# Patient Record
Sex: Male | Born: 1985 | Race: White | Hispanic: No | Marital: Married | Smoking: Former smoker
Health system: Southern US, Community
[De-identification: ages and names within clinical notes are randomized; demographics above are authoritative.]

## PROBLEM LIST (undated history)

## (undated) DIAGNOSIS — J302 Other seasonal allergic rhinitis: Secondary | ICD-10-CM

## (undated) DIAGNOSIS — G43909 Migraine, unspecified, not intractable, without status migrainosus: Secondary | ICD-10-CM

## (undated) DIAGNOSIS — F909 Attention-deficit hyperactivity disorder, unspecified type: Secondary | ICD-10-CM

## (undated) DIAGNOSIS — J45909 Unspecified asthma, uncomplicated: Secondary | ICD-10-CM

## (undated) HISTORY — DX: Other seasonal allergic rhinitis: J30.2

## (undated) HISTORY — DX: Unspecified asthma, uncomplicated: J45.909

## (undated) HISTORY — PX: TONSILLECTOMY AND ADENOIDECTOMY: SUR1326

## (undated) HISTORY — PX: ADENOIDECTOMY: SUR15

## (undated) HISTORY — DX: Attention-deficit hyperactivity disorder, unspecified type: F90.9

## (undated) HISTORY — DX: Migraine, unspecified, not intractable, without status migrainosus: G43.909

---

## 2005-01-07 ENCOUNTER — Emergency Department: Payer: Self-pay | Admitting: General Practice

## 2005-09-14 ENCOUNTER — Emergency Department (HOSPITAL_COMMUNITY): Admission: EM | Admit: 2005-09-14 | Discharge: 2005-09-14 | Payer: Self-pay | Admitting: Emergency Medicine

## 2006-03-01 ENCOUNTER — Ambulatory Visit (HOSPITAL_COMMUNITY): Admission: RE | Admit: 2006-03-01 | Discharge: 2006-03-01 | Payer: Self-pay | Admitting: Internal Medicine

## 2008-11-27 ENCOUNTER — Emergency Department (HOSPITAL_COMMUNITY): Admission: EM | Admit: 2008-11-27 | Discharge: 2008-11-27 | Payer: Self-pay | Admitting: Emergency Medicine

## 2008-12-30 ENCOUNTER — Emergency Department (HOSPITAL_COMMUNITY): Admission: EM | Admit: 2008-12-30 | Discharge: 2008-12-30 | Payer: Self-pay | Admitting: Emergency Medicine

## 2009-03-28 HISTORY — PX: COLONOSCOPY: SHX174

## 2009-04-05 ENCOUNTER — Emergency Department (HOSPITAL_COMMUNITY): Admission: EM | Admit: 2009-04-05 | Discharge: 2009-04-05 | Payer: Self-pay | Admitting: Emergency Medicine

## 2009-04-14 ENCOUNTER — Telehealth: Payer: Self-pay | Admitting: Gastroenterology

## 2009-05-05 ENCOUNTER — Ambulatory Visit: Payer: Self-pay | Admitting: Gastroenterology

## 2009-05-05 DIAGNOSIS — Z8719 Personal history of other diseases of the digestive system: Secondary | ICD-10-CM

## 2009-05-11 ENCOUNTER — Ambulatory Visit (HOSPITAL_COMMUNITY): Admission: RE | Admit: 2009-05-11 | Discharge: 2009-05-11 | Payer: Self-pay | Admitting: Gastroenterology

## 2009-05-13 ENCOUNTER — Encounter: Payer: Self-pay | Admitting: Gastroenterology

## 2009-05-25 ENCOUNTER — Ambulatory Visit: Payer: Self-pay | Admitting: Gastroenterology

## 2009-05-25 ENCOUNTER — Ambulatory Visit (HOSPITAL_COMMUNITY): Admission: RE | Admit: 2009-05-25 | Discharge: 2009-05-25 | Payer: Self-pay | Admitting: Gastroenterology

## 2009-06-08 ENCOUNTER — Ambulatory Visit: Payer: Self-pay | Admitting: Internal Medicine

## 2009-06-24 ENCOUNTER — Encounter: Payer: Self-pay | Admitting: Internal Medicine

## 2010-04-27 NOTE — Letter (Signed)
Summary: CT SCAN ORDER  CT SCAN ORDER   Imported By: Ave Filter 05/05/2009 16:52:44  _____________________________________________________________________  External Attachment:    Type:   Image     Comment:   External Document

## 2010-04-27 NOTE — Letter (Signed)
Summary: Internal Other  Internal Other   Imported By: Ave Filter 05/13/2009 13:01:30  _____________________________________________________________________  External Attachment:    Type:   Image     Comment:   External Document

## 2010-04-27 NOTE — Assessment & Plan Note (Signed)
Summary: DIVERTICULITISx1   Visit Type:  Initial Consult Primary Care Provider:  Margo Aye, M.D.  Chief Complaint:  diverticulitis.  History of Present Illness: Went to ED because he couldn't move from the pain in his back. Diagosed with the 1st episode of diverticulitis: pain in lower back, w/o constipation, abd pain, diarrhea, fever, or chiils, blood in stool, or black tarry stools. On Abx for 1 week: Sx got better but stiil has back pain ever so often. Appetite: here and there. No nausea, vomiting, heartburn, or indigestion. Had Ibuprofen and it did not help. Still has back pain.  Preventive Screening-Counseling & Management      Drug Use:  no.    Current Medications (verified): 1)  None  Allergies (verified): 1)  ! Asa 2)  ! Codeine  Past History:  Past Medical History: Kidney Stones (9) in 2010 R wrist fracture 2010  Past Surgical History: Tonsillectomy: age 25-5 yo  Family History: No FH of Colon Cancer Polyps: mother (< 55, path?), grandmother-diverticulosis No Family History of Breast Cancer: No Family History of Ovarian Cancer: No Family History of Pancreatic Cancer: No Family History of Stomach Cancer: No Family History of Uterine Cancer:  Social History: Quit chewing  ~ 1 week ago. Quit smoking 2 years. Occupation: work at [employer] and  [employer], Agricultural consultant at [employer]. Alcohol Use - yes 1-2x/mo, can go 3-4 mos w/o Illicit Drug Use - no Marriedx4 years, no kids, just horses and dogs.Drug Use:  no  Review of Systems       I personally reviewed CT AP w/o IV contrastJan 2011 w/ Dr. Montel Culver evidence of diverticulitis, SI joints normal.  Per HPI otherwise all systems negative.  Vital Signs:  Patient profile:   25 year old male Height:      72 inches Weight:      198 pounds BMI:     26.95 Temp:     97.9 degrees F oral Pulse rate:   80 / minute BP sitting:   112 / 70  (left arm) Cuff size:   regular  Vitals Entered By:  Hendricks Limes LPN (May 05, 2009 4:12 PM)  Physical Exam  General:  Well developed, well nourished, no acute distress. Head:  Normocephalic and atraumatic. Eyes:  PERRLA, no icterus. Mouth:  No deformity or lesions. Neck:  Supple; no masses. Lungs:  Clear throughout to auscultation. Heart:  Regular rate and rhythm; no murmurs Abdomen:  Soft,nondistended. No masses noted. Normal bowel sounds. Mild LLQ tenderness without guarding, and without rebound.   Extremities:  No clubbing, cyanosis, edema or deformities noted. Neurologic:  Alert and  oriented x4;  grossly normal neurologically.  Impression & Recommendations:  Problem # 1:  DIVERTICULITIS, HX OF (ICD-V12.79)  Differential diagnosis include low likelihood of IBD or colon cancer. Repeat Ct Scan A/P w/ iv and oral contrast ASAP. OPV in 4 weeks. Will need TCS in near future. VICODIN #25 as needed PAIN. Do not use with Tylenol. Side effects include drowsiness, constipation, and itching. May cause nausea and vomiting. Do not operate heavy machinery while taking Vicodin. Explained no long tern narcotics if no evidnec of inflammation on CT.  CC: PCP  ADDENDUM 2911: pt requested CT on feb 14 because it's his day off.  Orders: Consultation Level III (16109) Prescriptions: VICODIN 5-500 MG TABS (HYDROCODONE-ACETAMINOPHEN) 1-2 by mouth q4-6h as needed pain  #25 x 0   Entered and Authorized by:   West Bali MD   Signed by:  West Bali MD on 05/05/2009   Method used:   Print then Give to Patient   RxID:   [id number]

## 2010-04-27 NOTE — Progress Notes (Signed)
Summary: Diverticulitis 1st episode  Called dr. Margo Aye. Pt with episode of diverticulitis. Finishe Abx witin the next week. OPV w/ [facility] in first 2 weeks of FEB. May need an urgent slot. Will need TCS after visit. West Bali MD  April 14, 2009 3:51 PM

## 2010-04-27 NOTE — Letter (Signed)
Summary: release of records  release of records   Imported By: Diana Eves 06/24/2009 16:30:28  _____________________________________________________________________  External Attachment:    Type:   Image     Comment:   External Document

## 2010-04-27 NOTE — Assessment & Plan Note (Signed)
Summary: FU OV 4WKS(WEEK OF 05/26/09)GLU   Visit Type:  Follow-up Visit Primary Care Provider:  Dwana Melena  Chief Complaint:  F/U  diverticulitis.  History of Present Illness: Here for f/u. Since last OV he had TCS which showed frequent diverticula in the transverse, descending and sigmoid colon. He continues to have pain in the lower back, more on the left side. Pain occurs up to 2 times per day and lasts for 15 mins at a time. Denies fever, dysuria, hematuria, chronic back pain. Pain feels like when he was first diagnosed with diverticulitis in January. Does not feel like his kidney stones. BM only 1-2 times per day instead of like norm of 3-4. Stools formed, hard at times. No melena, brbpr.   Current Medications (verified): 1)  Vicodin 5-500 Mg Tabs (Hydrocodone-Acetaminophen) .Marland Kitchen.. 1-2 By Mouth Q4-6h As Needed Pain  Allergies (verified): 1)  ! Asa 2)  ! Codeine  Past History:  Past Medical History: Kidney Stones (9) in 2010 R wrist fracture 2010 Diverticulitis  Social History: Quit chewing  ~ 1 week ago. Quit smoking 2 years. Occupation: work at [employer] and  [employer], Agricultural consultant at [employer]. Alcohol Use - yes 1-2x/mo, can go 3-4 mos w/o Illicit Drug Use - no Marriedx4 years, no kids, just horses and dogs.  Review of Systems      See HPI  Vital Signs:  Patient profile:   25 year old male Height:      72 inches Weight:      198 pounds BMI:     26.95 Temp:     97.7 degrees F oral Pulse rate:   80 / minute BP sitting:   110 / 80  (left arm) Cuff size:   regular  Vitals Entered By: Cloria Spring LPN (June 08, 2009 1:13 PM)  Physical Exam  General:  Well developed, well nourished, no acute distress. Head:  Normocephalic and atraumatic. Eyes:   no scleral icterus.  Mouth:  OP moist. Lungs:  Clear throughout to auscultation. Heart:  Regular rate and rhythm; no murmurs, rubs,  or bruits. Abdomen:  Bowel sounds normal.  Abdomen is soft,  nontender, nondistended.  No rebound or guarding.  No hepatosplenomegaly, masses or hernias.  No abdominal bruits.  Msk:  No point tenderness of lower back. No rash.  Extremities:  No clubbing, cyanosis, edema or deformities noted. Neurologic:  Alert and  oriented x4;  grossly normal neurologically. Skin:  Intact without significant lesions or rashes. Psych:  Alert and cooperative. Normal mood and affect.  Impression & Recommendations:  Problem # 1:  DIVERTICULITIS, HX OF (ICD-V12.79)  No abdominal pain but continues to have back pain. Abd exam benign. CT one month ago showed near complete resolution of diverticulitis.  ?back pain secondary to recurrent low-grade diverticulitis vs renal stone vs musculoskeletal. Will discuss further with Dr. Darrick Penna. ?10 days of cipro/flagyl?  Orders: Est. Patient Level II (28315)     Appended Document: FU OV 4WKS(WEEK OF 05/26/09)GLU Discussed with Dr. Darrick Penna. Recommends for ongoing pain, to get another CT A/P to look for recurrent diverticulitis. Patient presented previously with atypical symptoms  ie back pain.   Called patient. States he was feeling better. Little upset stomach this am, ?secondary to something he ate. Patient will call on Monday with progress report. If persisting back/abd pain, then he will agree with CT, but he does not want to do right now since he feels better.   Advised if he develops worsening symptoms,  fever, vomiting over the weekend, to go to ED.

## 2010-06-13 LAB — COMPREHENSIVE METABOLIC PANEL
Albumin: 3.8 g/dL (ref 3.5–5.2)
Alkaline Phosphatase: 63 U/L (ref 39–117)
CO2: 28 mEq/L (ref 19–32)
Chloride: 98 mEq/L (ref 96–112)
GFR calc non Af Amer: >60 mL/min (ref >60)
Glucose, Bld: 88 mg/dL (ref 70–99)
Potassium: 3.8 mEq/L (ref 3.5–5.1)
Total Bilirubin: 1.6 mg/dL — ABNORMAL HIGH (ref 0.3–1.2)

## 2010-06-13 LAB — CBC
HCT: 44.3 % (ref 39.0–52.0)
MCHC: 34.6 g/dL (ref 30.0–36.0)
MCV: 88.2 fL (ref 78.0–100.0)
Platelets: 264 10*3/uL (ref 150–400)
WBC: 9.8 10*3/uL (ref 4.0–10.5)

## 2010-06-13 LAB — URINALYSIS, ROUTINE W REFLEX MICROSCOPIC
Ketones, ur: NEGATIVE mg/dL
Specific Gravity, Urine: 1.018 (ref 1.005–1.030)
pH: 6.5 (ref 5.0–8.0)

## 2010-06-13 LAB — DIFFERENTIAL
Basophils Relative: 0 % (ref 0–1)
Eosinophils Absolute: 0.3 10*3/uL (ref 0.0–0.7)
Eosinophils Relative: 3 % (ref 0–5)
Lymphs Abs: 2.6 10*3/uL (ref 0.7–4.0)
Monocytes Relative: 10 % (ref 3–12)
Neutro Abs: 5.9 10*3/uL (ref 1.7–7.7)

## 2010-07-01 LAB — DIFFERENTIAL
Basophils Absolute: 0.1 10*3/uL (ref 0.0–0.1)
Basophils Relative: 1 % (ref 0–1)
Eosinophils Absolute: 0.7 10*3/uL (ref 0.0–0.7)
Eosinophils Relative: 7 % — ABNORMAL HIGH (ref 0–5)

## 2010-07-01 LAB — URINALYSIS, ROUTINE W REFLEX MICROSCOPIC
Glucose, UA: NEGATIVE mg/dL
Ketones, ur: NEGATIVE mg/dL
Leukocytes, UA: NEGATIVE
Specific Gravity, Urine: 1.026 (ref 1.005–1.030)
Urobilinogen, UA: 0.2 mg/dL (ref 0.0–1.0)

## 2010-07-01 LAB — COMPREHENSIVE METABOLIC PANEL
Alkaline Phosphatase: 71 U/L (ref 39–117)
BUN: 9 mg/dL (ref 6–23)
CO2: 27 mEq/L (ref 19–32)
Chloride: 106 mEq/L (ref 96–112)
Creatinine, Ser: 1.11 mg/dL (ref 0.4–1.5)
GFR calc Af Amer: >60 mL/min (ref >60)
GFR calc non Af Amer: >60 mL/min (ref >60)
Glucose, Bld: 99 mg/dL (ref 70–99)
Potassium: 4.3 mEq/L (ref 3.5–5.1)
Sodium: 142 mEq/L (ref 135–145)
Total Bilirubin: 1.3 mg/dL — ABNORMAL HIGH (ref 0.3–1.2)
Total Protein: 7.3 g/dL (ref 6.0–8.3)

## 2010-07-01 LAB — CBC
MCHC: 34.5 g/dL (ref 30.0–36.0)
RBC: 5.56 MIL/uL (ref 4.22–5.81)
RDW: 12.4 % (ref 11.5–15.5)

## 2010-07-02 LAB — URINE MICROSCOPIC-ADD ON

## 2010-07-02 LAB — POCT I-STAT, CHEM 8
BUN: 15 mg/dL (ref 6–23)
Calcium, Ion: 1.11 mmol/L — ABNORMAL LOW (ref 1.12–1.32)
Creatinine, Ser: 1.1 mg/dL (ref 0.4–1.5)
Glucose, Bld: 113 mg/dL — ABNORMAL HIGH (ref 70–99)

## 2010-07-02 LAB — URINALYSIS, ROUTINE W REFLEX MICROSCOPIC
Ketones, ur: 15 mg/dL — AB
Nitrite: NEGATIVE
Protein, ur: NEGATIVE mg/dL
Urobilinogen, UA: 1 mg/dL (ref 0.0–1.0)

## 2013-11-01 ENCOUNTER — Encounter: Payer: Self-pay | Admitting: Gastroenterology

## 2013-12-05 ENCOUNTER — Ambulatory Visit: Payer: BC Managed Care – PPO | Admitting: Gastroenterology

## 2013-12-09 ENCOUNTER — Encounter: Payer: Self-pay | Admitting: Gastroenterology

## 2013-12-09 ENCOUNTER — Encounter (INDEPENDENT_AMBULATORY_CARE_PROVIDER_SITE_OTHER): Payer: Self-pay

## 2013-12-09 ENCOUNTER — Ambulatory Visit (INDEPENDENT_AMBULATORY_CARE_PROVIDER_SITE_OTHER): Payer: BC Managed Care – PPO | Admitting: Gastroenterology

## 2013-12-09 VITALS — BP 138/87 | HR 106 | Temp 97.9°F | Ht 72.0 in | Wt 222.2 lb

## 2013-12-09 DIAGNOSIS — K625 Hemorrhage of anus and rectum: Secondary | ICD-10-CM

## 2013-12-09 NOTE — Patient Instructions (Signed)
I have ordered a CT scan for further evaluation. We will likely need to proceed with a colonoscopy thereafter.   You may use Preparation H over the counter twice a day for 7 days in the meantime.

## 2013-12-09 NOTE — Progress Notes (Signed)
Referring Provider: Dr. Margo Aye Primary Care Physician:  Dr. Margo Aye Primary Gastroenterologist:  Dr. Darrick Penna   Chief Complaint  Patient presents with  . Blood In Stools    several years. gotten worse recently    HPI:   28 year old male presents with history of atypical presentation of diverticulitis in 2011, rectal bleeding, with colonoscopy on file from that time. Returning now with worsening rectal bleeding and pain. Notes lower back discomfort, sometimes so bad can't get out of bed. Used to act up with food, now without any aggravating factors. Intermittent. Will use tramadol for pain but does not consistently relieve. No fever or chills. No constipation or diarrhea. Occasionally rare rectal discomfort, notes will have a BM then still feel like he has to go for at least an hour after. No unexplained weight loss, lack of appetite. No abdominal pain. Presented with back pain with diverticulitis in 2011. Pain 2-3 times per week. Worsening over past year. Rectal bleeding usually every day.   Past Medical History  Diagnosis Date  . ADHD (attention deficit hyperactivity disorder)   . Asthma   . Seasonal allergies   . Migraines     Past Surgical History  Procedure Laterality Date  . Colonoscopy  2011    Dr. Darrick Penna: frequent diverticula in transverse, descending, and sigmoid colon. Small internal hemorrhoids  . Tonsillectomy and adenoidectomy      Current Outpatient Prescriptions  Medication Sig Dispense Refill  . amphetamine-dextroamphetamine (ADDERALL) 20 MG tablet 2 (two) times daily.      . diclofenac (VOLTAREN) 50 MG EC tablet Take 50 mg by mouth 2 (two) times daily. prn      . Probiotic Product (PROBIOTIC DAILY PO) Take by mouth.      . traMADol (ULTRAM-ER) 100 MG 24 hr tablet Take 100 mg by mouth as needed for pain.       No current facility-administered medications for this visit.    Allergies as of 12/09/2013 - Review Complete 12/09/2013  Allergen Reaction Noted  .  Aspirin    . Codeine      Family History  Problem Relation Age of Onset  . Colon cancer Neg Hx   . Diverticulitis Mother   . Diverticulitis Father     History   Social History  . Marital Status: Married    Spouse Name: N/A    Number of Children: N/A  . Years of Education: N/A   Occupational History  . Not on file.   Social History Main Topics  . Smoking status: Light Tobacco Smoker  . Smokeless tobacco: Not on file     Comment: Smoke occasionally, dip often  . Alcohol Use: Yes     Comment: rarely   . Drug Use: No  . Sexual Activity: Not on file   Other Topics Concern  . Not on file   Social History Narrative  . No narrative on file    Review of Systems: As mentioned in HPI.   Physical Exam: BP 138/87  Pulse 106  Temp(Src) 97.9 F (36.6 C) (Oral)  Ht 6' (1.829 m)  Wt 222 lb 3.2 oz (100.789 kg)  BMI 30.13 kg/m2 General:   Alert and oriented. Well-developed, well-nourished, pleasant and cooperative. Head:  Normocephalic and atraumatic. Eyes:  Conjunctiva pink, sclera clear, no icterus.   Conjunctiva pink. Ears:  Normal auditory acuity. Nose:  No deformity, discharge,  or lesions. Mouth:  No deformity or lesions, mucosa pink and moist.  Lungs:  Clear to  auscultation bilaterally, without wheezing, rales, or rhonchi.  Heart:  S1, S2 present without murmurs noted.  Abdomen:  +BS, soft, non-tender and non-distended. Without mass or HSM. No rebound or guarding. No hernias noted. Rectal:  Deferred  Msk:  Symmetrical without gross deformities. Normal posture. Extremities:  Without clubbing or edema. Neurologic:  Alert and  oriented x4;  grossly normal neurologically. Skin:  Intact, warm and dry without significant lesions or rashes Psych:  Alert and cooperative. Normal mood and affect.

## 2013-12-13 NOTE — Assessment & Plan Note (Signed)
28 year old male with worsening rectal bleeding, known history of internal hemorrhoids, and worsening left flank/back pain similar to presentation with diverticulitis in 2011. Likely benign anorectal bleeding, but worsening pain concerning. Needs CT scan now. If non-specific findings, proceed with colonoscopy with Dr. Darrick Penna. Risks and benefits discussed in detail with stated understanding.

## 2013-12-17 NOTE — Progress Notes (Signed)
cc'ed to pcp °

## 2013-12-27 ENCOUNTER — Ambulatory Visit (HOSPITAL_COMMUNITY)
Admission: RE | Admit: 2013-12-27 | Discharge: 2013-12-27 | Disposition: A | Discharge status: Home / Self Care | Payer: BC Managed Care – PPO | Source: Ambulatory Visit | Attending: Gastroenterology | Admitting: Gastroenterology

## 2013-12-27 DIAGNOSIS — R109 Unspecified abdominal pain: Secondary | ICD-10-CM | POA: Diagnosis present

## 2013-12-27 DIAGNOSIS — K625 Hemorrhage of anus and rectum: Secondary | ICD-10-CM

## 2013-12-27 MED ORDER — IOHEXOL 300 MG/ML  SOLN
100.0000 mL | Freq: Once | INTRAMUSCULAR | Status: AC | PRN
  Administered 2013-12-27: 100 mL via INTRAVENOUS

## 2013-12-31 ENCOUNTER — Telehealth: Payer: Self-pay | Admitting: Gastroenterology

## 2013-12-31 NOTE — Telephone Encounter (Signed)
Pt called to see if his CT results were back yet. [phone]

## 2014-01-01 NOTE — Telephone Encounter (Signed)
I told pt the results have not been sent to me yet.

## 2014-01-01 NOTE — Progress Notes (Signed)
Quick Note:  Pt is aware and OK to schedule the colonoscopy. ______

## 2014-01-01 NOTE — Progress Notes (Signed)
Quick Note:  Per my last note, I think we should update a colonoscopy with Dr. Darrick PennaFields to exclude occult issues, especially with persistent rectal bleeding.  If this is benign, likely dealing with musculoskeletal etiology. ______

## 2014-01-01 NOTE — Progress Notes (Signed)
Quick Note:  Called and informed pt. He said he is still having the lower back pain right above his buttocks. The pain is intermittent most of the time, but sometimes last for a couple of days. He is not having any nausea or vomiting and his BM's are normal and regular. Please advise! ______

## 2014-01-01 NOTE — Telephone Encounter (Signed)
See result note.  

## 2014-01-01 NOTE — Progress Notes (Signed)
Quick Note:  CT without evidence of diverticulitis. How is patient? ______

## 2014-01-02 NOTE — Progress Notes (Signed)
Quick Note:  Needs office visit. ______

## 2014-01-03 ENCOUNTER — Encounter: Payer: Self-pay | Admitting: Gastroenterology

## 2014-01-03 NOTE — Progress Notes (Signed)
APPT MADE AND LETTER SENT  °

## 2014-02-06 ENCOUNTER — Ambulatory Visit: Payer: BC Managed Care – PPO | Admitting: Gastroenterology

## 2014-03-04 ENCOUNTER — Encounter: Payer: Self-pay | Admitting: Gastroenterology

## 2014-03-04 ENCOUNTER — Ambulatory Visit: Payer: BC Managed Care – PPO | Admitting: Gastroenterology

## 2014-03-04 ENCOUNTER — Telehealth: Payer: Self-pay | Admitting: Gastroenterology

## 2014-03-04 NOTE — Telephone Encounter (Signed)
PATIENT WAS A NO SHOW ON 03/04/14 AND LETTER WAS SENT

## 2014-10-28 IMAGING — CT CT ABD-PELV W/ CM
2 of 4 series · 16 of 46 positions shown, 18 images · IV contrast (Omnipaque 300)
Comparison: 05/11/2009

CLINICAL DATA: Left flank pain.  History of diverticulitis.

EXAM:
CT ABDOMEN AND PELVIS WITH CONTRAST
TECHNIQUE: Multidetector CT imaging of the abdomen and pelvis was performed
using the standard protocol following bolus administration of
intravenous contrast.
CONTRAST:  100mL OMNIPAQUE IOHEXOL 300 MG/ML  SOLN

[Series 2: abd_pel_with 5.0 b40f · axial · 0.75mm/px · z∈[-533,-43]mm · 13 of 108 slices shown, 15 images]
[im 5/108  soft-tissue]
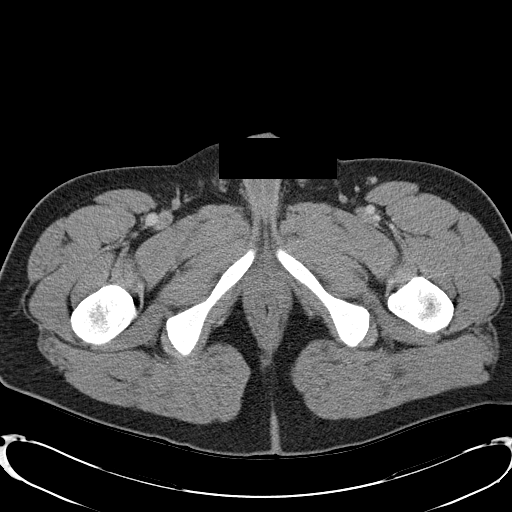
[im 5/108  bone]
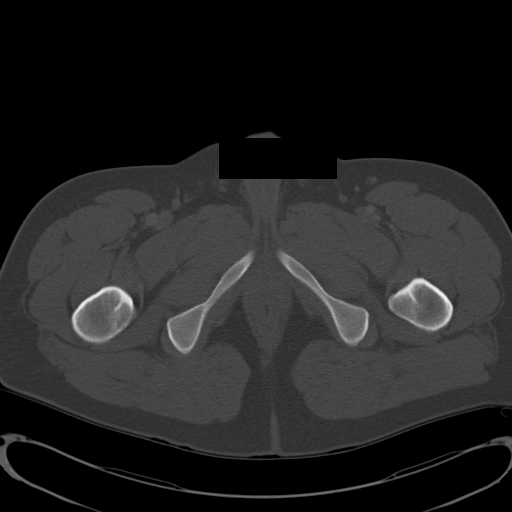
[im 14/108  soft-tissue]
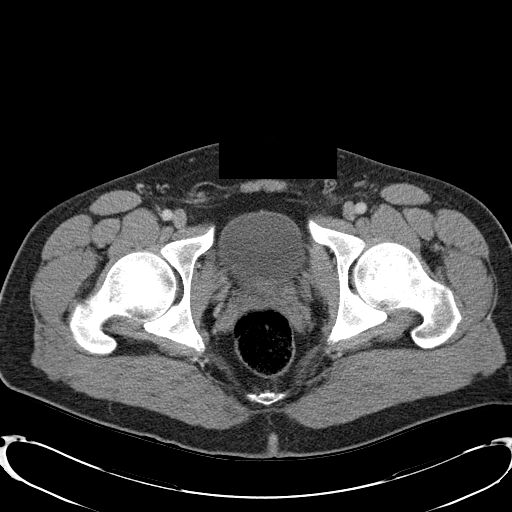
[im 23/108  soft-tissue]
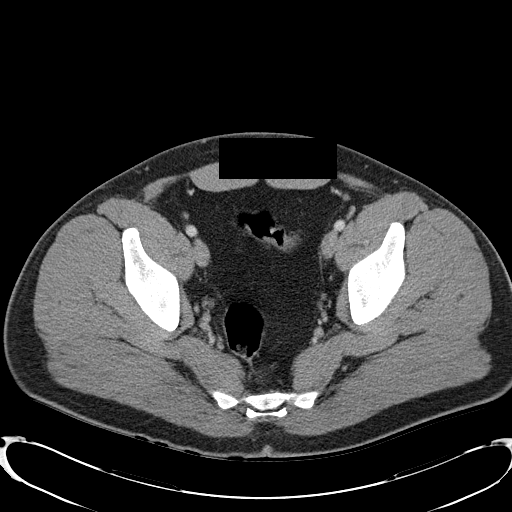
[im 32/108  soft-tissue]
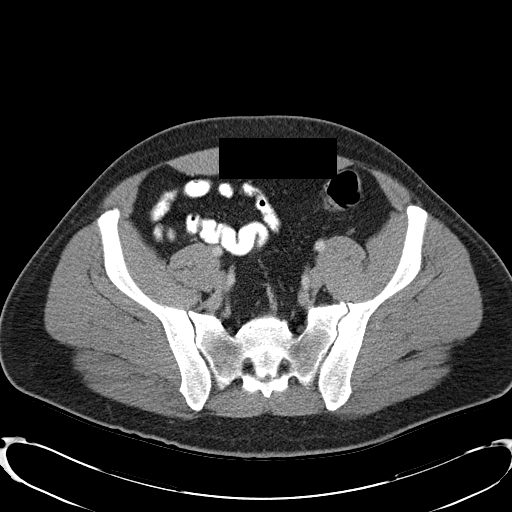
[im 36/108  soft-tissue]
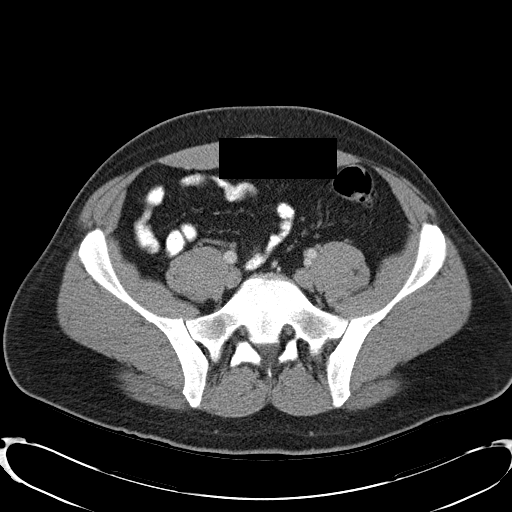
[im 45/108  soft-tissue]
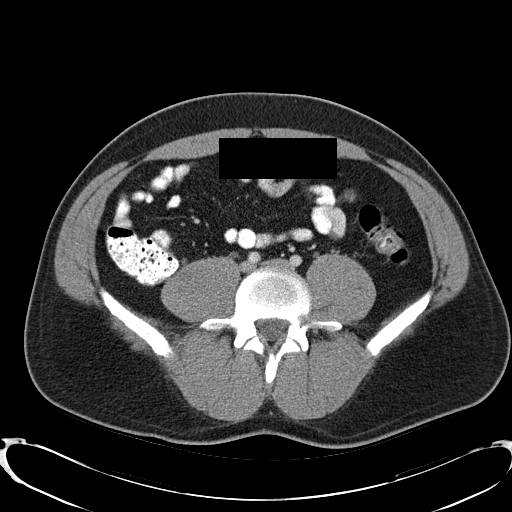
[im 54/108  soft-tissue]
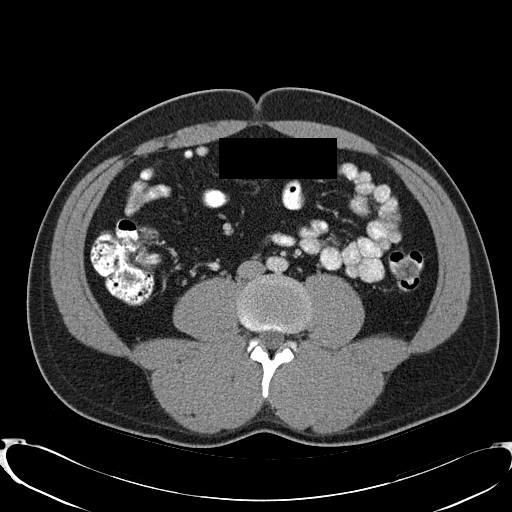
[im 63/108  soft-tissue]
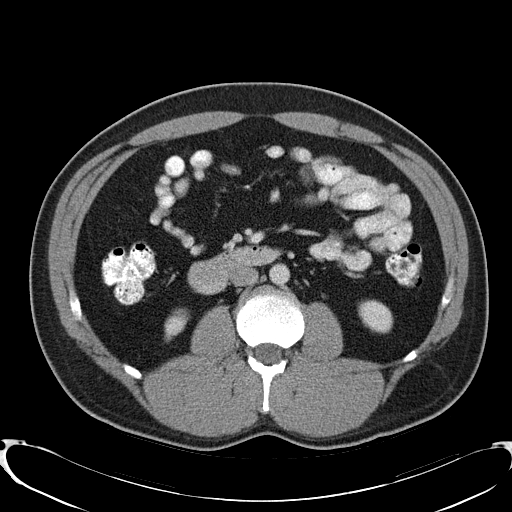
[im 72/108  soft-tissue]
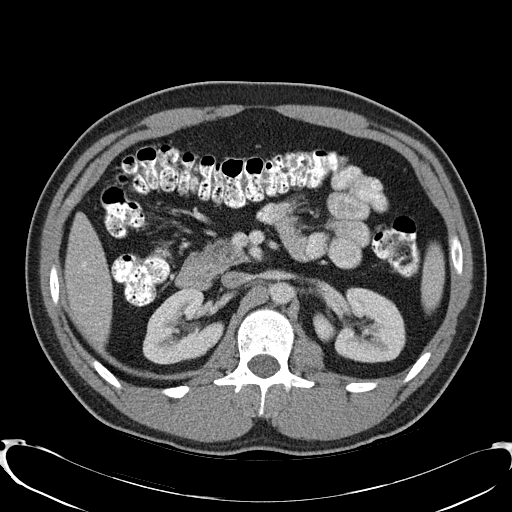
[im 72/108  bone]
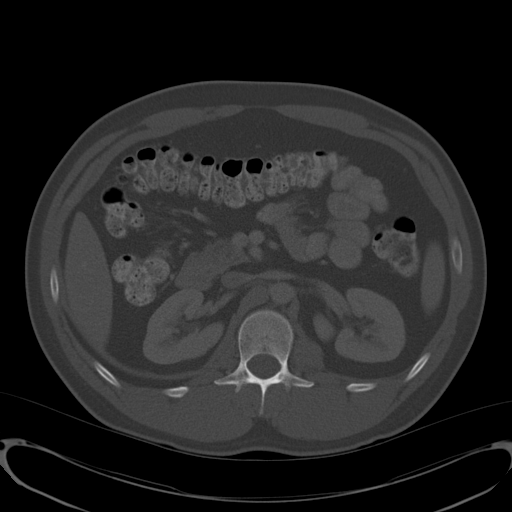
[im 76/108  soft-tissue]
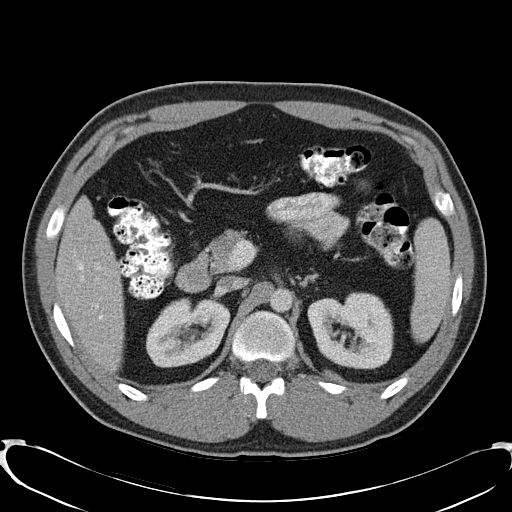
[im 85/108  soft-tissue]
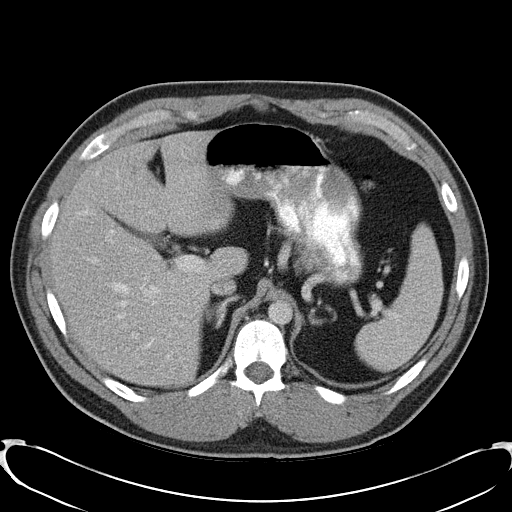
[im 94/108  soft-tissue]
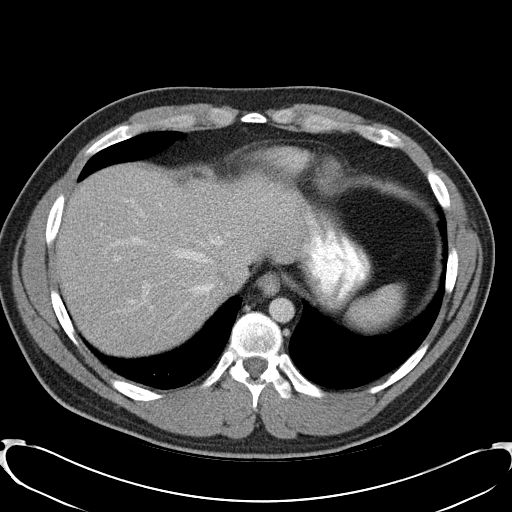
[im 103/108  soft-tissue]
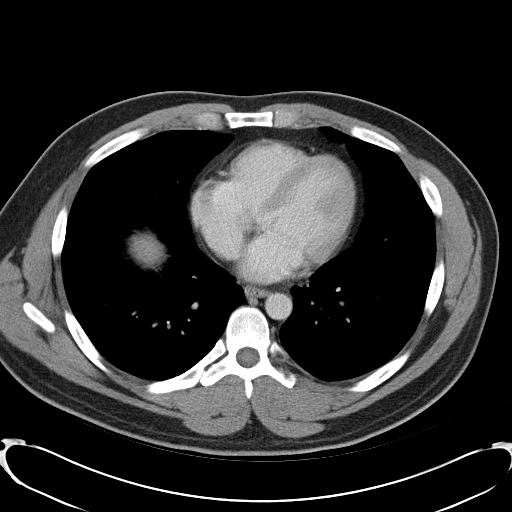

[Series 3: abd_pel_with 3.0 spo cor · coronal · 0.71mm/px · 3 of 89 slices shown]
[im 30/89  soft-tissue]
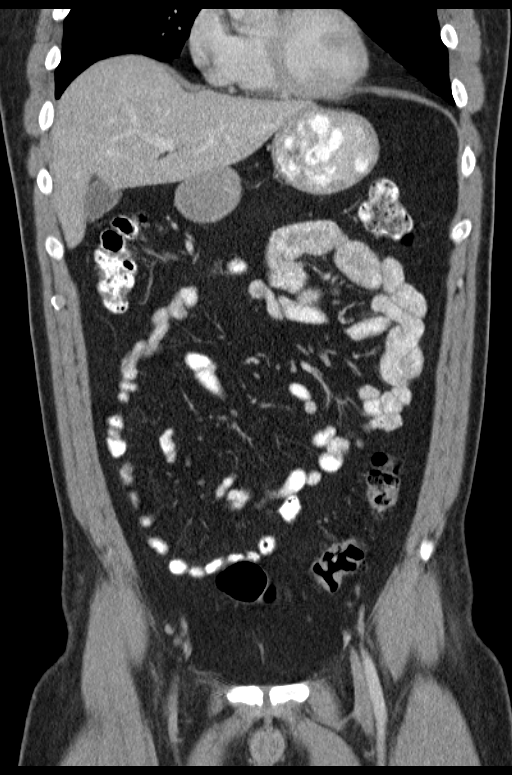
[im 40/89  soft-tissue]
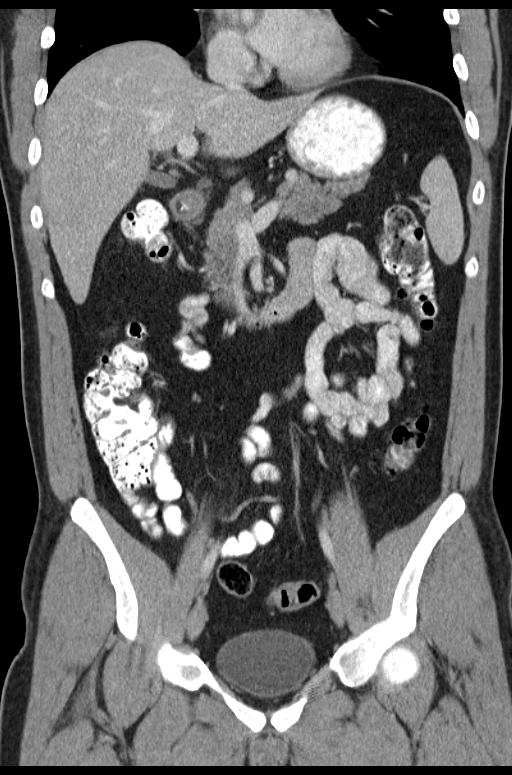
[im 49/89  soft-tissue]
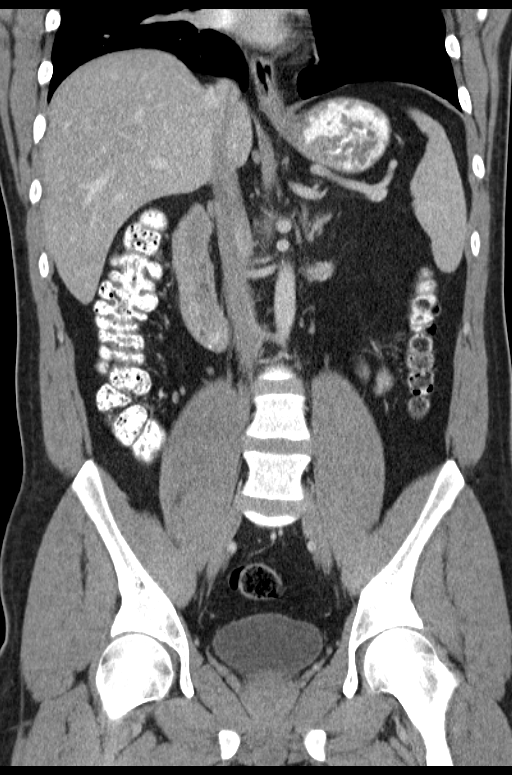

[16 of 46 positions shown; findings below may reference images not displayed]

FINDINGS: Lower chest: Subsegmental atelectasis or scarring in the lingula and
anteriorly in the left lower lobe.

Hepatobiliary: Intact

Pancreas: Intact

Spleen: Intact

Adrenals/Urinary Tract: 2 mm nonobstructive right mid kidney
calculus.

Stomach/Bowel: Descending and sigmoid colon diverticulosis without
active diverticulitis identified. Scattered diverticula in the
remainder the colon.

Vascular/Lymphatic: Intact

Reproductive: Intact

Other: No supplemental non-categorized findings.

Musculoskeletal: Intact
IMPRESSION: 1. Because of the patient's left flank pain is not identified. There
is colonic diverticulosis more concentrated in the descending and
sigmoid colon, but at this time I do not identify active
diverticulitis.
2. 2 mm nonobstructive right mid kidney calculus.

## 2015-07-28 ENCOUNTER — Ambulatory Visit (INDEPENDENT_AMBULATORY_CARE_PROVIDER_SITE_OTHER): Payer: BLUE CROSS/BLUE SHIELD | Admitting: Allergy and Immunology

## 2015-07-28 ENCOUNTER — Encounter: Payer: Self-pay | Admitting: Allergy and Immunology

## 2015-07-28 VITALS — BP 124/74 | HR 96 | Temp 97.7°F | Resp 12 | Ht 71.65 in | Wt 208.3 lb

## 2015-07-28 DIAGNOSIS — J309 Allergic rhinitis, unspecified: Secondary | ICD-10-CM | POA: Diagnosis not present

## 2015-07-28 DIAGNOSIS — H1010 Acute atopic conjunctivitis, unspecified eye: Secondary | ICD-10-CM | POA: Diagnosis not present

## 2015-07-28 DIAGNOSIS — G43909 Migraine, unspecified, not intractable, without status migrainosus: Secondary | ICD-10-CM

## 2015-07-28 DIAGNOSIS — G43109 Migraine with aura, not intractable, without status migrainosus: Secondary | ICD-10-CM | POA: Diagnosis not present

## 2015-07-28 MED ORDER — MONTELUKAST SODIUM 10 MG PO TABS: 10.0000 mg | ORAL_TABLET | Freq: Every day | ORAL | Status: AC

## 2015-07-28 MED ORDER — METHYLPREDNISOLONE ACETATE 80 MG/ML IJ SUSP
80.0000 mg | Freq: Once | INTRAMUSCULAR | Status: AC
  Administered 2015-07-28: 80 mg via INTRAMUSCULAR

## 2015-07-28 NOTE — Progress Notes (Signed)
Dear Dr. Margo AyeHall,  Thank you for referring Bradley [facility]ukesim[facility] W Friend [facility]. [facility] 07/[facility]/2015.   Below is a summation of this patient's evaluation and recommendations.  Thank you for your referral. I will keep you informed about this patient's response to treatment.   If you have any questions please to do hestitate to contact me.   Sincerely,  Bradley PriestEric J. Kozlow, Bradley Hudson [facility]   ______________________________________________________________________    NEW PATIENT NOTE  Referring Provider: Benita Hudson, Bradley Hudson, Bradley Hudson Primary Provider: Dwana MelenaZack Hall, Bradley Hudson Date of office visit: 07/28/2015    Subjective:   Chief Complaint:  Bradley Dukesimothy W Whittlesey Jr. (DOB: [birth year]) is a 30 y.o. male with a chief complaint of Nasal Congestion  who presents to the clinic on 07/28/2015 with the following problems:  HPI: Bradley Hudson presents to this clinic in evaluation of persistent upper respiratory tract symptoms over the course of the past year or so. He has nasal congestion and sneezing and clear rhinorrhea, that can occasionally be ugly, that does not appear to have any obvious trigger. He also has facial pressure headache that's throbbing not associated with any scotoma or vomiting or other neurological symptoms occurring about twice a week lasting about 5 hours for which he'll take ibuprofen. He also doesn't have a very keen sense of smell. He has intermittent ability to smell food but he can taste without any problem. He does have a history of childhood asthma that for the most part has resolved although he does use a bronchodilator about 1 time per year for an issue with finding it "hard to breathe," without any coughing or wheezing. He does not have any exercise-induced bronchospastic symptoms or cold air induced bronchospastic symptoms.  Past Medical History  Diagnosis Date  . ADHD (attention deficit hyperactivity disorder)   .  Asthma   . Seasonal allergies   . Migraines     Past Surgical History  Procedure Laterality Date  . Colonoscopy  2011    Dr. Darrick PennaFields: frequent diverticula in transverse, descending, and sigmoid colon. Small internal hemorrhoids  . Tonsillectomy and adenoidectomy    . Adenoidectomy        Medication List           amphetamine-dextroamphetamine 20 MG tablet  Commonly known as:  ADDERALL  2 (two) times daily.     cetirizine 10 MG tablet  Commonly known as:  ZYRTEC  Take 10 mg by mouth daily.     diclofenac 50 MG EC tablet  Commonly known as:  VOLTAREN  Take 50 mg by mouth 2 (two) times daily. prn     PROBIOTIC DAILY PO  Take by mouth. Reported on 07/28/2015     traMADol 100 MG 24 hr tablet  Commonly known as:  ULTRAM-ER  Take 100 mg by mouth as needed for pain.        Allergies  Allergen Reactions  . Aspirin   . Codeine     Review of systems negative except as noted in HPI / PMHx or noted below:  Review of Systems  Constitutional: Negative.   HENT: Negative.   Eyes: Negative.   Respiratory: Negative.   Cardiovascular: Negative.   Gastrointestinal: Negative.   Genitourinary: Negative.   Musculoskeletal: Negative.   Skin: Negative.   Neurological: Negative.   Endo/Heme/Allergies: Negative.   Psychiatric/Behavioral: Negative.     Family History  Problem Relation Age of Onset  .  Colon cancer Neg Hx   . Diverticulitis Mother   . Hypertension Mother   . Diabetes Mother   . Diverticulitis Father   . Hypertension Father   . Diabetes Father     Social History   Social History  . Marital Status: Married    Spouse Name: N/A  . Number of Children: N/A  . Years of Education: N/A   Occupational History  . Not on file.   Social History Main Topics  . Smoking status: Former Smoker    Quit date: 03/30/2015  . Smokeless tobacco: Current User     Comment: Smoke occasionally, dip often  . Alcohol Use: Yes     Comment: rarely   . Drug Use: No  .  Sexual Activity: Not on file   Other Topics Concern  . Not on file   Social History Narrative    Environmental and Social history  Lives in a house with a dry environment, a cat and dog and hedgehog and hamster located inside the household, no carpeting in the bedroom, no plastic on the bed or pillow, and a girlfriend who smokes tobacco products inside the car. He is a EMT.   Objective:   Filed Vitals:   07/28/15 0827  BP: 124/74  Pulse: 96  Temp: 97.7 F (36.5 C)  Resp: 12   Height: 5' 11.65" (182 cm) Weight: 208 lb 5.4 oz (94.5 kg)  Physical Exam  Constitutional: He is well-developed, well-nourished, and in no distress.  HENT:  Head: Normocephalic. Head is without right periorbital erythema and without left periorbital erythema.  Right Ear: Tympanic membrane, external ear and ear canal normal.  Left Ear: Tympanic membrane, external ear and ear canal normal.  Nose: Mucosal edema present. No rhinorrhea.  Mouth/Throat: Oropharynx is clear and moist and mucous membranes are normal. No oropharyngeal exudate.  Eyes: Conjunctivae and lids are normal. Pupils are equal, round, and reactive to light.  Neck: Trachea normal. No tracheal deviation present. No thyromegaly present.  Cardiovascular: Normal rate, regular rhythm, S1 normal, S2 normal and normal heart sounds.   No murmur heard. Pulmonary/Chest: Effort normal. No stridor. No tachypnea. No respiratory distress. He has no wheezes. He has no rales. He exhibits no tenderness.  Abdominal: Soft. He exhibits no distension and no mass. There is no hepatosplenomegaly. There is no tenderness. There is no rebound and no guarding.  Musculoskeletal: He exhibits no edema or tenderness.  Lymphadenopathy:       Head (right side): No tonsillar adenopathy present.       Head (left side): No tonsillar adenopathy present.    He has no cervical adenopathy.    He has no axillary adenopathy.  Neurological: He is alert. Gait normal.  Skin: No  rash noted. He is not diaphoretic. No erythema. No pallor. Nails show no clubbing.  Psychiatric: Mood and affect normal.     Diagnostics: Allergy skin tests were performed. He demonstrated hypersensitivity to dog and mold.    Assessment and Plan:    1. Allergic rhinoconjunctivitis   2. Migraine syndrome     1. Allergen avoidance measures and avoidance measures regarding tobacco smoke exposure  2. Treat and prevent inflammation:   A. OTC Rhinocort one spray each nostril one time per day  B. montelukast 10 mg one tablet one time per day  C. Depo-Medrol 80 IM delivered in clinic today  3.  If needed:   A. nasal saline spray  B. cetirizine 10 mg one tablet once a day  4.  Consolidate all caffeine and chocolate consumption  5.  Return to clinic in 4 weeks or earlier if problem  Bradley Hudson will utilize the plan described above to eliminate any respiratory tract inflammation that may be present. As well, I would like for him to consolidate all caffeine and chocolate consumption given the fact that he does appear to have migraine. I'll regroup with him in approximately 4 weeks or earlier if there is a problem and will make a decision about how to proceed pending his response.  Bradley Priest, Bradley Hudson [facility]

## 2015-07-28 NOTE — Patient Instructions (Addendum)
  1. Allergen avoidance measures and avoidance measures regarding tobacco smoke exposure  2. Treat and prevent inflammation:   A. OTC Rhinocort one spray each nostril one time per day  B. montelukast 10 mg one tablet one time per day  C. Depo-Medrol 80 IM delivered in clinic today  3.  If needed:   A. nasal saline spray  B. cetirizine 10 mg one tablet once a day  4.  Consolidate all caffeine and chocolate consumption  5.  Return to clinic in 4 weeks or earlier if problem

## 2015-08-25 ENCOUNTER — Encounter: Payer: Self-pay | Admitting: Allergy and Immunology

## 2015-08-25 ENCOUNTER — Ambulatory Visit (INDEPENDENT_AMBULATORY_CARE_PROVIDER_SITE_OTHER): Payer: No Typology Code available for payment source | Admitting: Allergy and Immunology

## 2015-08-25 VITALS — BP 100/80 | HR 88 | Resp 18

## 2015-08-25 DIAGNOSIS — H1010 Acute atopic conjunctivitis, unspecified eye: Secondary | ICD-10-CM | POA: Diagnosis not present

## 2015-08-25 DIAGNOSIS — G43109 Migraine with aura, not intractable, without status migrainosus: Secondary | ICD-10-CM | POA: Diagnosis not present

## 2015-08-25 DIAGNOSIS — G43909 Migraine, unspecified, not intractable, without status migrainosus: Secondary | ICD-10-CM

## 2015-08-25 DIAGNOSIS — J309 Allergic rhinitis, unspecified: Secondary | ICD-10-CM | POA: Diagnosis not present

## 2015-08-25 DIAGNOSIS — J329 Chronic sinusitis, unspecified: Secondary | ICD-10-CM

## 2015-08-25 MED ORDER — AMOXICILLIN-POT CLAVULANATE 875-125 MG PO TABS: ORAL_TABLET | ORAL | Status: DC

## 2015-08-25 NOTE — Progress Notes (Signed)
Follow-up Note  Referring Provider: Benita StabileHall, John Z, MD Primary Provider: Dwana MelenaZack Hall, MD Date of Office Visit: 08/25/2015  Subjective:   Bradley Dukesimothy W Tolson Jr. (DOB: 07-Sep-1985) is a 30 y.o. male who [facility] in re-evaluation of the following:  HPI: Bradley Hudson presents this clinic noting that he still continues to have significant problems with nasal congestion and sneezing and some ugly nasal discharge as well as some continued headaches. His sense of smell is still not very good. Smoke has been removed from the household. He has been consistently using medical therapy. He has consolidate caffeine and chocolate consumption.    Medication List           amphetamine-dextroamphetamine 20 MG tablet  Commonly known as:  ADDERALL  2 (two) times daily.     cetirizine 10 MG tablet  Commonly known as:  ZYRTEC  Take 10 mg by mouth daily. Reported on 08/25/2015     diclofenac 50 MG EC tablet  Commonly known as:  VOLTAREN  Take 50 mg by mouth 2 (two) times daily. prn     montelukast 10 MG tablet  Commonly known as:  SINGULAIR  Take 1 tablet (10 mg total) by mouth at bedtime.     PROBIOTIC DAILY PO  Take by mouth. Reported on 07/28/2015     RHINOCORT ALLERGY 32 MCG/ACT nasal spray  Generic drug:  budesonide  Place 1 spray into both nostrils daily.     traMADol 100 MG 24 hr tablet  Commonly known as:  ULTRAM-ER  Take 100 mg by mouth as needed for pain.        Past Medical History  Diagnosis Date  . ADHD (attention deficit hyperactivity disorder)   . Asthma   . Seasonal allergies   . Migraines     Past Surgical History  Procedure Laterality Date  . Colonoscopy  2011    Dr. Darrick PennaFields: frequent diverticula in transverse, descending, and sigmoid colon. Small internal hemorrhoids  . Tonsillectomy and adenoidectomy    . Adenoidectomy      Allergies  Allergen Reactions  . Aspirin   . Codeine     Review of systems negative except as  noted in HPI / PMHx or noted below:  Review of Systems  Constitutional: Negative.   HENT: Negative.   Eyes: Negative.   Respiratory: Negative.   Cardiovascular: Negative.   Gastrointestinal: Negative.   Genitourinary: Negative.   Musculoskeletal: Negative.   Skin: Negative.   Neurological: Negative.   Endo/Heme/Allergies: Negative.   Psychiatric/Behavioral: Negative.      Objective:   Filed Vitals:   08/25/15 1131  BP: 100/80  Pulse: 88  Resp: 18          Physical Exam  Constitutional: He is well-developed, well-nourished, and in no distress.  HENT:  Head: Normocephalic.  Right Ear: Tympanic membrane, external ear and ear canal normal.  Left Ear: Tympanic membrane, external ear and ear canal normal.  Nose: Mucosal edema present. No rhinorrhea.  Mouth/Throat: Uvula is midline, oropharynx is clear and moist and mucous membranes are normal. No oropharyngeal exudate.  Eyes: Conjunctivae are normal.  Neck: Trachea normal. No tracheal tenderness present. No tracheal deviation present. No thyromegaly present.  Cardiovascular: Normal rate, regular rhythm, S1 normal, S2 normal and normal heart sounds.   No murmur heard. Pulmonary/Chest: Breath sounds normal. No stridor. No respiratory distress. He has no wheezes. He has no rales.  Musculoskeletal: He exhibits no edema.  Lymphadenopathy:       Head (right side): No tonsillar adenopathy present.       Head (left side): No tonsillar adenopathy present.    He has no cervical adenopathy.  Neurological: He is alert. Gait normal.  Skin: No rash noted. He is not diaphoretic. No erythema. Nails show no clubbing.  Psychiatric: Mood and affect normal.    Diagnostics: None   Assessment and Plan:   1. Allergic rhinoconjunctivitis   2. Migraine syndrome   3. Chronic sinusitis, unspecified location     1. Allergen avoidance measures and avoidance measures regarding tobacco smoke exposure  2. Continue to treat and prevent  inflammation:   A. OTC Rhinocort one spray each nostril one time per day  B. montelukast 10 mg one tablet one time per day  3.  If needed:   A. nasal saline spray  B. cetirizine 10 mg one tablet once a day  4.  Continue to consolidate all caffeine and chocolate consumption  5. Treat infection:   A. Augmentin 875 one tablet two times a day for 14 days  6. Consider a course of immunotherapy  7. Return to clinic in 12 weeks or earlier if problem  I will now treat Bradley Hudson with 14 days of Augmentin assuming that he may have contracted a component of sinusitis complicating his allergic rhinoconjunctivitis. He will continue to remain on anti-inflammatory medications as specified above and he would definitely be a candidate for immunotherapy as he does appear to have failed medical treatment. I did give him literature on this form of therapy during this last visit. We will see how things go over the course of the next 12 weeks or he can return to this clinic or earlier if there is a problem.  Laurette Schimke, MD [facility]

## 2015-08-25 NOTE — Patient Instructions (Addendum)
  1. Allergen avoidance measures and avoidance measures regarding tobacco smoke exposure  2. Continue to treat and prevent inflammation:   A. OTC Rhinocort one spray each nostril one time per day  B. montelukast 10 mg one tablet one time per day  3.  If needed:   A. nasal saline spray  B. cetirizine 10 mg one tablet once a day  4.  Continue to consolidate all caffeine and chocolate consumption  5. Treat infection:   A. Augmentin 875 one tablet two times a day for 14 days  6. Consider a course of immunotherapy  7. Return to clinic in 12 weeks or earlier if problem

## 2015-11-17 ENCOUNTER — Ambulatory Visit: Payer: No Typology Code available for payment source | Admitting: Allergy and Immunology

## 2016-11-02 NOTE — Progress Notes (Signed)
REVIEWED-NO ADDITIONAL RECOMMENDATIONS. 

## 2017-03-07 ENCOUNTER — Other Ambulatory Visit: Payer: Self-pay

## 2017-03-07 ENCOUNTER — Emergency Department
Admission: EM | Admit: 2017-03-07 | Discharge: 2017-03-07 | Disposition: A | Discharge status: Home / Self Care | Payer: Self-pay | Attending: Emergency Medicine | Admitting: Emergency Medicine

## 2017-03-07 DIAGNOSIS — F17220 Nicotine dependence, chewing tobacco, uncomplicated: Secondary | ICD-10-CM | POA: Insufficient documentation

## 2017-03-07 DIAGNOSIS — J09X2 Influenza due to identified novel influenza A virus with other respiratory manifestations: Secondary | ICD-10-CM | POA: Insufficient documentation

## 2017-03-07 DIAGNOSIS — J45909 Unspecified asthma, uncomplicated: Secondary | ICD-10-CM | POA: Insufficient documentation

## 2017-03-07 DIAGNOSIS — J101 Influenza due to other identified influenza virus with other respiratory manifestations: Secondary | ICD-10-CM

## 2017-03-07 DIAGNOSIS — Z79899 Other long term (current) drug therapy: Secondary | ICD-10-CM | POA: Insufficient documentation

## 2017-03-07 LAB — INFLUENZA PANEL BY PCR (TYPE A & B)
Influenza A By PCR: POSITIVE — AB
Influenza B By PCR: NEGATIVE

## 2017-03-07 MED ORDER — IBUPROFEN 800 MG PO TABS
800.0000 mg | ORAL_TABLET | Freq: Three times a day (TID) | ORAL | 0 refills | Status: AC | PRN
Start: 2017-03-07 — End: ?

## 2017-03-07 MED ORDER — SODIUM CHLORIDE 0.9 % IV BOLUS (SEPSIS)
1000.0000 mL | Freq: Once | INTRAVENOUS | Status: AC
  Administered 2017-03-07: 1000 mL via INTRAVENOUS

## 2017-03-07 MED ORDER — KETOROLAC TROMETHAMINE 30 MG/ML IJ SOLN
30.0000 mg | Freq: Once | INTRAMUSCULAR | Status: AC
  Administered 2017-03-07: 30 mg via INTRAVENOUS
  Filled 2017-03-07: qty 1

## 2017-03-07 MED ORDER — OSELTAMIVIR PHOSPHATE 75 MG PO CAPS: 75.0000 mg | ORAL_CAPSULE | Freq: Two times a day (BID) | ORAL | 0 refills | Status: AC

## 2017-03-07 MED ORDER — ONDANSETRON HCL 4 MG/2ML IJ SOLN
4.0000 mg | Freq: Once | INTRAMUSCULAR | Status: AC
  Administered 2017-03-07: 4 mg via INTRAVENOUS
  Filled 2017-03-07: qty 2

## 2017-03-07 MED ORDER — PROMETHAZINE-DM 6.25-15 MG/5ML PO SYRP: 5.0000 mL | ORAL_SOLUTION | Freq: Four times a day (QID) | ORAL | 0 refills | Status: DC | PRN

## 2017-03-07 NOTE — ED Provider Notes (Signed)
[facility] Emergency Department Provider Note   ____________________________________________   First MD Initiated Contact with Patient 03/07/17 1052     (approximate)  I have reviewed the triage vital signs and the nursing notes.   HISTORY  Chief Complaint URI    HPI Bradley Dukesimothy W Kerschner Jr. is a 31 y.o. male patient complaining of cough, nasal and chest congestion, nausea and body aches for 2 days.No palliative measures for complaint. Patient's the intermittent fever and chills.  Pataient has not taken a flu shot this season.   Past Medical History:  Diagnosis Date  . ADHD (attention deficit hyperactivity disorder)   . Asthma   . Migraines   . Seasonal allergies     Patient Active Problem List   Diagnosis Date Noted  . Rectal bleeding 12/09/2013  . DIVERTICULITIS, HX OF 05/05/2009    Past Surgical History:  Procedure Laterality Date  . ADENOIDECTOMY    . COLONOSCOPY  2011   Dr. Darrick PennaFields: frequent diverticula in transverse, descending, and sigmoid colon. Small internal hemorrhoids  . TONSILLECTOMY AND ADENOIDECTOMY      Prior to Admission medications   Medication Sig Start Date End Date Taking? Authorizing Provider  amoxicillin-clavulanate (AUGMENTIN) 875-125 MG tablet Take one tablet twice daily for fourteen days. 08/25/15   Kozlow, Alvira PhilipsEric J, MD  amphetamine-dextroamphetamine (ADDERALL) 20 MG tablet 2 (two) times daily. 10/22/13   [provider]  budesonide (RHINOCORT ALLERGY) 32 MCG/ACT nasal spray Place 1 spray into both nostrils daily.    [provider]  cetirizine (ZYRTEC) 10 MG tablet Take 10 mg by mouth daily. Reported on 08/25/2015    [provider]  diclofenac (VOLTAREN) 50 MG EC tablet Take 50 mg by mouth 2 (two) times daily. prn    [provider]  ibuprofen (ADVIL,MOTRIN) 800 MG tablet Take 1 tablet (800 mg total) by mouth every 8 (eight) hours as needed for moderate pain. 03/07/17   Joni ReiningSmith, Lashay Osborne  K, PA-C  montelukast (SINGULAIR) 10 MG tablet Take 1 tablet (10 mg total) by mouth at bedtime. 07/28/15   Kozlow, Alvira PhilipsEric J, MD  oseltamivir (TAMIFLU) 75 MG capsule Take 1 capsule (75 mg total) by mouth 2 (two) times daily for 5 days. 03/07/17 03/12/17  Joni ReiningSmith, Jakai Onofre K, PA-C  Probiotic Product (PROBIOTIC DAILY PO) Take by mouth. Reported on 07/28/2015    [provider]  promethazine-dextromethorphan (PROMETHAZINE-DM) 6.25-15 MG/5ML syrup Take 5 mLs by mouth 4 (four) times daily as needed for cough. 03/07/17   Joni ReiningSmith, Williom Cedar K, PA-C  traMADol (ULTRAM-ER) 100 MG 24 hr tablet Take 100 mg by mouth as needed for pain.    [provider]    Allergies Aspirin and Codeine  Family History  Problem Relation Age of Onset  . Diverticulitis Mother   . Hypertension Mother   . Diabetes Mother   . Diverticulitis Father   . Hypertension Father   . Diabetes Father   . Colon cancer Neg Hx     Social History Social History   Tobacco Use  . Smoking status: Former Smoker    Last attempt to quit: 03/30/2015    Years since quitting: 1.9  . Smokeless tobacco: Current User  . Tobacco comment: Smoke occasionally, dip often  Substance Use Topics  . Alcohol use: Yes    Comment: rarely   . Drug use: No    Review of Systems  Constitutional: Fever chills and body aches  Eyes: No visual changes. ENT: Nasal congestion  Cardiovascular: Denies  chest pain. Respiratory: Denies shortness of breath. Gastrointestinal: No abdominal pain.  No nausea, no vomiting.  No diarrhea.  No constipation. Genitourinary: Negative for dysuria. Musculoskeletal: Negative for back pain. Skin: Negative for rash. Neurological: Negative for headaches, focal weakness or numbness. Psychiatric:ADHD Endocrine: Hematological/Lymphatic: Allergic/Immunilogical: **}  ____________________________________________   PHYSICAL EXAM:  VITAL SIGNS: ED Triage Vitals  Enc Vitals Group     BP 03/07/17 1029 (!) 234/78      Pulse Rate 03/07/17 1029 (!) 113     Resp 03/07/17 1029 20     Temp 03/07/17 1029 98.2 F (36.8 C)     Temp Source 03/07/17 1029 Oral     SpO2 03/07/17 1029 97 %     Weight 03/07/17 1023 220 lb (99.8 kg)     Height 03/07/17 1023 5\' 11"  (1.803 m)     Head Circumference --      Peak Flow --      Pain Score 03/07/17 1023 8     Pain Loc --      Pain Edu? --      Excl. in GC? --    Constitutional: Alert and oriented. Appears malaise Eyes: Conjunctivae are normal. PERRL. EOMI. Head: Atraumatic. Nose:  congestion/rhinnorhea. Mouth/Throat: Mucous membranes are moist.  Oropharynx non-erythematous. Neck: No stridor.  No cervical spine tenderness to palpation. Hematological/Lymphatic/Immunilogical: No cervical lymphadenopathy. Cardiovascular: Tachycardic. Grossly normal heart sounds.  Good peripheral circulation. Respiratory: Normal respiratory effort.  No retractions. Lungs CTAB. Gastrointestinal: Soft and nontender. No distention. No abdominal bruits. No CVA tenderness. Musculoskeletal: No lower extremity tenderness nor edema.  No joint effusions. Neurologic:  Normal speech and language. No gross focal neurologic deficits are appreciated. No gait instability. Skin:  Skin is warm, dry and intact. No rash noted. Psychiatric: Mood and affect are normal. Speech and behavior are normal.  ____________________________________________   LABS (all labs ordered are listed, but only abnormal results are displayed)  Labs Reviewed  INFLUENZA PANEL BY PCR (TYPE A & B) - Abnormal; Notable for the following components:      Result Value   Influenza A By PCR POSITIVE (*)    All other components within normal limits   ____________________________________________  EKG   ____________________________________________  RADIOLOGY  No results found.  ____________________________________________   PROCEDURES  Procedure(s) performed: None  Procedures  Critical Care performed:  No  ____________________________________________   INITIAL IMPRESSION / ASSESSMENT AND PLAN / ED COURSE  As part of my medical decision making, I reviewed the following data within the electronic MEDICAL RECORD NUMBER    Patient's positive for influenza A. Patient given discharge Instructions advised take medication as directed. Patient given a work no. Patient [facility] as [facility].      ____________________________________________   FINAL CLINICAL IMPRESSION(S) / ED DIAGNOSES  Final diagnoses:  Influenza A     ED Discharge Orders        Ordered    oseltamivir (TAMIFLU) 75 MG capsule  2 times daily     03/07/17 1246    promethazine-dextromethorphan (PROMETHAZINE-DM) 6.25-15 MG/5ML syrup  4 times daily PRN     03/07/17 1246    ibuprofen (ADVIL,MOTRIN) 800 MG tablet  Every 8 hours PRN     03/07/17 1246       Note:  This document was prepared using Dragon voice recognition software and may include unintentional dictation errors.    Joni ReiningSmith, Keiland Pickering K, PA-C 03/07/17 1248    Nita SickleVeronese, [place], MD 03/10/17 [phone]

## 2017-03-07 NOTE — ED Triage Notes (Signed)
Pt c/o cough/congestion with bodyaches for the past couple of days.

## 2017-03-07 NOTE — ED Notes (Signed)
See triage note. Presents with body aches ,nasal /chest chest for couple of days  Intermittent prod cough  afebrile on arrival

## 2019-05-08 ENCOUNTER — Ambulatory Visit
Admission: EM | Admit: 2019-05-08 | Discharge: 2019-05-08 | Disposition: A | Discharge status: Home / Self Care | Payer: Non-veteran care | Attending: Emergency Medicine | Admitting: Emergency Medicine

## 2019-05-08 ENCOUNTER — Other Ambulatory Visit: Payer: Self-pay

## 2019-05-08 ENCOUNTER — Encounter: Payer: Self-pay | Admitting: Emergency Medicine

## 2019-05-08 DIAGNOSIS — A084 Viral intestinal infection, unspecified: Secondary | ICD-10-CM

## 2019-05-08 DIAGNOSIS — Z20822 Contact with and (suspected) exposure to covid-19: Secondary | ICD-10-CM

## 2019-05-08 LAB — POC SARS CORONAVIRUS 2 AG -  ED: SARS Coronavirus 2 Ag: NEGATIVE

## 2019-05-08 NOTE — ED Triage Notes (Addendum)
Diarrhea started Sunday evening 05/05/2019.  Today has had 4 diarrhea episodes.  Patient is requesting rapid test, deferred to provider

## 2019-05-08 NOTE — ED Triage Notes (Signed)
Nasal swab in lab 

## 2019-05-08 NOTE — ED Provider Notes (Signed)
[facility]   [id number] 05/08/19 Arrival Time: 1036   CC: Diarrhea  SUBJECTIVE: History from: patient.  Bradley Hudson. is a 34 y.o. male who presents with improving diarrhea (states initially had around 15 episodes, 6 episodes yesterday, 4-5 episodes today) that began 4 days.  Denies sick exposure to COVID, flu or strep.  Denies recent travel, antibiotic use, or diet changes.  Has tried imodium with minimal relief.  Denies aggravating factors.  Reports previous symptoms in the past with a GI bug.   Denies fever, chills, fatigue, sinus pain, rhinorrhea, sore throat, cough, SOB, wheezing, chest pain, nausea, vomiting, changes in bladder habits, hematchezia, melena.    ROS: As per HPI.  All other pertinent ROS negative.     Past Medical History:  Diagnosis Date  . ADHD (attention deficit hyperactivity disorder)   . Asthma   . Migraines   . Seasonal allergies    Past Surgical History:  Procedure Laterality Date  . ADENOIDECTOMY    . COLONOSCOPY  2011   Dr. Oneida Alar: frequent diverticula in transverse, descending, and sigmoid colon. Small internal hemorrhoids  . TONSILLECTOMY AND ADENOIDECTOMY     Allergies  Allergen Reactions  . Aspirin   . Codeine    No current facility-administered medications on file prior to encounter.   Current Outpatient Medications on File Prior to Encounter  Medication Sig Dispense Refill  . amphetamine-dextroamphetamine (ADDERALL) 20 MG tablet 2 (two) times daily.    . cetirizine (ZYRTEC) 10 MG tablet Take 10 mg by mouth daily. Reported on 08/25/2015    . loperamide (IMODIUM A-D) 2 MG tablet Take 2 mg by mouth 4 (four) times daily as needed for diarrhea or loose stools.    . montelukast (SINGULAIR) 10 MG tablet Take 1 tablet (10 mg total) by mouth at bedtime. 30 tablet 5  . budesonide (RHINOCORT ALLERGY) 32 MCG/ACT nasal spray Place 1 spray into both nostrils daily.    Marland Kitchen ibuprofen (ADVIL,MOTRIN) 800 MG tablet Take 1 tablet (800 mg total)  by mouth every 8 (eight) hours as needed for moderate pain. 15 tablet 0   Social History   Socioeconomic History  . Marital status: Married    Spouse name: Not on file  . Number of children: Not on file  . Years of education: Not on file  . Highest education level: Not on file  Occupational History  . Not on file  Tobacco Use  . Smoking status: Former Smoker    Quit date: 03/30/2015    Years since quitting: 4.1  . Smokeless tobacco: Current User  . Tobacco comment: Smoke occasionally, dip often  Substance and Sexual Activity  . Alcohol use: Yes    Comment: rarely   . Drug use: No  . Sexual activity: Not on file  Other Topics Concern  . Not on file  Social History Narrative  . Not on file   Social Determinants of Health   Financial Resource Strain:   . Difficulty of Paying Living Expenses: Not on file  Food Insecurity:   . Worried About Charity fundraiser in the Last Year: Not on file  . Ran Out of Food in the Last Year: Not on file  Transportation Needs:   . Lack of Transportation (Medical): Not on file  . Lack of Transportation (Non-Medical): Not on file  Physical Activity:   . Days of Exercise per Week: Not on file  . Minutes of Exercise per Session: Not on file  Stress:   .  Feeling of Stress : Not on file  Social Connections:   . Frequency of Communication with Friends and Family: Not on file  . Frequency of Social Gatherings with Friends and Family: Not on file  . Attends Religious Services: Not on file  . Active Member of Clubs or Organizations: Not on file  . Attends Archivist Meetings: Not on file  . Marital Status: Not on file  Intimate Partner Violence:   . Fear of Current or Ex-Partner: Not on file  . Emotionally Abused: Not on file  . Physically Abused: Not on file  . Sexually Abused: Not on file   Family History  Problem Relation Age of Onset  . Diverticulitis Mother   . Hypertension Mother   . Diabetes Mother   . Diverticulitis  Father   . Hypertension Father   . Diabetes Father   . Colon cancer Neg Hx     OBJECTIVE:  Vitals:   05/08/19 1047  BP: 121/76  Pulse: (!) 110  Resp: 18  Temp: 98.3 F (36.8 C)  TempSrc: Oral  SpO2: 96%    HR: rechecked 98-101 bpm  General appearance: alert; appears mildly fatigued, but nontoxic; speaking in full sentences and tolerating own secretions HEENT: NCAT; Ears: EACs clear, TMs pearly gray; Eyes: PERRL.  EOM grossly intact. Nose: nares patent without rhinorrhea, Throat: oropharynx clear, tonsils non erythematous or enlarged, uvula midline  Neck: supple without LAD Lungs: unlabored respirations, symmetrical air entry; cough: absent; no respiratory distress; CTAB Heart: regular rate and rhythm.  Cap refill < 2 seconds Abdomen: soft, nondistended, normal active bowel sounds; mild diffuse tenderness; no guarding  Skin: warm and dry Psychological: alert and cooperative; normal mood and affect  LABS:  Results for orders placed or performed during the hospital encounter of 05/08/19 (from the past 24 hour(s))  POC SARS Coronavirus 2 Ag-ED - Nasal Swab (BD Veritor Kit)     Status: None   Collection Time: 05/08/19 11:11 AM  Result Value Ref Range   SARS Coronavirus 2 Ag Negative Negative     ASSESSMENT & PLAN:  1. Suspected COVID-19 virus infection   2. Viral gastroenteritis    Rapid COVID negative.  Culture sent.  Patient should remain in quarantine until they have received culture results.  If negative you may resume normal activities (go back to work/school) while practicing hand hygiene, social distance, and mask wearing.  If positive, patient should remain in quarantine for 10 days from symptom onset AND greater than 72 hours after symptoms resolution (absence of fever without the use of fever-reducing medication and improvement in respiratory symptoms), whichever is longer Get plenty of rest and push fluids Supplement with oral rehydration solution and/or pedialyte  to replenish lost electrolytes Use OTC medications like ibuprofen or tylenol as needed fever or pain Call or go to the ED if you have any new or worsening symptoms such as fever, cough, shortness of breath, chest tightness, chest pain, turning blue, changes in mental status, etc...    Reviewed expectations re: course of current medical issues. Questions answered. Outlined signs and symptoms indicating need for more acute intervention. Patient verbalized understanding. After Visit Summary given.         Lestine Box, PA-C 05/08/19 1205

## 2019-05-08 NOTE — Discharge Instructions (Signed)
Rapid COVID negative.  Culture sent.  Patient should remain in quarantine until they have received culture results.  If negative you may resume normal activities (go back to work/school) while practicing hand hygiene, social distance, and mask wearing.  If positive, patient should remain in quarantine for 10 days from symptom onset AND greater than 72 hours after symptoms resolution (absence of fever without the use of fever-reducing medication and improvement in respiratory symptoms), whichever is longer Get plenty of rest and push fluids Supplement with oral rehydration solution and/or pedialyte to replenish lost electrolytes Use OTC medications like ibuprofen or tylenol as needed fever or pain Call or go to the ED if you have any new or worsening symptoms such as fever, cough, shortness of breath, chest tightness, chest pain, turning blue, changes in mental status, etc..Marland Kitchen

## 2019-05-09 LAB — NOVEL CORONAVIRUS, NAA: SARS-CoV-2, NAA: NOT DETECTED

## 2019-09-25 ENCOUNTER — Other Ambulatory Visit: Payer: Self-pay

## 2019-09-25 ENCOUNTER — Emergency Department (HOSPITAL_COMMUNITY)
Admission: EM | Admit: 2019-09-25 | Discharge: 2019-09-26 | Disposition: A | Discharge status: Home / Self Care | Payer: No Typology Code available for payment source | Attending: Emergency Medicine | Admitting: Emergency Medicine

## 2019-09-25 ENCOUNTER — Encounter (HOSPITAL_COMMUNITY): Payer: Self-pay

## 2019-09-25 DIAGNOSIS — J45909 Unspecified asthma, uncomplicated: Secondary | ICD-10-CM | POA: Diagnosis not present

## 2019-09-25 DIAGNOSIS — Z87891 Personal history of nicotine dependence: Secondary | ICD-10-CM | POA: Diagnosis not present

## 2019-09-25 DIAGNOSIS — L03114 Cellulitis of left upper limb: Secondary | ICD-10-CM | POA: Diagnosis not present

## 2019-09-25 DIAGNOSIS — M79642 Pain in left hand: Secondary | ICD-10-CM | POA: Diagnosis present

## 2019-09-25 NOTE — ED Triage Notes (Signed)
Pt states that he has a bump  Pop up on his hand. Redness and swelling noted. Tender to touch. Showed up yesterday after noon . Denies any known injuries or bites

## 2019-09-26 MED ORDER — CEPHALEXIN 500 MG PO CAPS: 500.0000 mg | ORAL_CAPSULE | Freq: Three times a day (TID) | ORAL | 0 refills | Status: AC

## 2019-09-26 MED ORDER — CEPHALEXIN 500 MG PO CAPS
500.0000 mg | ORAL_CAPSULE | Freq: Once | ORAL | Status: AC
  Administered 2019-09-26: 500 mg via ORAL
  Filled 2019-09-26: qty 1

## 2019-09-26 MED ORDER — DOXYCYCLINE HYCLATE 100 MG PO TABS
100.0000 mg | ORAL_TABLET | Freq: Once | ORAL | Status: AC
  Administered 2019-09-26: 100 mg via ORAL
  Filled 2019-09-26: qty 1

## 2019-09-26 MED ORDER — HYDROCODONE-ACETAMINOPHEN 5-325 MG PO TABS: 1.0000 | ORAL_TABLET | Freq: Four times a day (QID) | ORAL | 0 refills | Status: AC | PRN

## 2019-09-26 MED ORDER — DOXYCYCLINE HYCLATE 100 MG PO CAPS: 100.0000 mg | ORAL_CAPSULE | Freq: Two times a day (BID) | ORAL | 0 refills | Status: AC

## 2019-09-26 MED ORDER — SULFAMETHOXAZOLE-TRIMETHOPRIM 800-160 MG PO TABS: 1.0000 | ORAL_TABLET | Freq: Once | ORAL | Status: DC

## 2019-09-26 NOTE — ED Provider Notes (Signed)
[facility] EMERGENCY DEPARTMENT Provider Note   CSN: [id number] Arrival date & time: 09/25/19  2245     History Chief Complaint  Patient presents with  . Hand Injury    Bradley Hudson. is a 34 y.o. male.  The history is provided by the patient.  Hand Pain This is a new problem. The current episode started yesterday. The problem occurs constantly. The problem has been gradually worsening. Pertinent negatives include no chest pain and no abdominal pain. Exacerbated by: Palpation. Nothing relieves the symptoms. He has tried nothing for the symptoms.  Patient reports over the past day he has had increasing pain and redness to the left hand.  No trauma, no insect bites. He is not diabetic.     Past Medical History:  Diagnosis Date  . ADHD (attention deficit hyperactivity disorder)   . Asthma   . Migraines   . Seasonal allergies     Patient Active Problem List   Diagnosis Date Noted  . Rectal bleeding 12/09/2013  . DIVERTICULITIS, HX OF 05/05/2009    Past Surgical History:  Procedure Laterality Date  . ADENOIDECTOMY    . COLONOSCOPY  2011   Dr. Darrick Penna: frequent diverticula in transverse, descending, and sigmoid colon. Small internal hemorrhoids  . TONSILLECTOMY AND ADENOIDECTOMY         Family History  Problem Relation Age of Onset  . Diverticulitis Mother   . Hypertension Mother   . Diabetes Mother   . Diverticulitis Father   . Hypertension Father   . Diabetes Father   . Colon cancer Neg Hx     Social History   Tobacco Use  . Smoking status: Former Smoker    Quit date: 03/30/2015    Years since quitting: 4.4  . Smokeless tobacco: Current User  . Tobacco comment: Smoke occasionally, dip often  Substance Use Topics  . Alcohol use: Yes    Comment: rarely   . Drug use: No    Home Medications Prior to Admission medications   Medication Sig Start Date End Date Taking? Authorizing Provider  amphetamine-dextroamphetamine (ADDERALL) 20 MG tablet 2  (two) times daily. 10/22/13   [provider]  budesonide (RHINOCORT ALLERGY) 32 MCG/ACT nasal spray Place 1 spray into both nostrils daily.    [provider]  cephALEXin (KEFLEX) 500 MG capsule Take 1 capsule (500 mg total) by mouth 3 (three) times daily. 09/26/19   Zadie Rhine, MD  cetirizine (ZYRTEC) 10 MG tablet Take 10 mg by mouth daily. Reported on 08/25/2015    [provider]  doxycycline (VIBRAMYCIN) 100 MG capsule Take 1 capsule (100 mg total) by mouth 2 (two) times daily. One po bid x 7 days 09/26/19   Zadie Rhine, MD  ibuprofen (ADVIL,MOTRIN) 800 MG tablet Take 1 tablet (800 mg total) by mouth every 8 (eight) hours as needed for moderate pain. 03/07/17   Joni Reining, PA-C  loperamide (IMODIUM A-D) 2 MG tablet Take 2 mg by mouth 4 (four) times daily as needed for diarrhea or loose stools.    [provider]  montelukast (SINGULAIR) 10 MG tablet Take 1 tablet (10 mg total) by mouth at bedtime. 07/28/15   Kozlow, Alvira Philips, MD    Allergies    Aspirin and Codeine  Review of Systems   Review of Systems  Constitutional: Negative for fever.  Cardiovascular: Negative for chest pain.  Gastrointestinal: Negative for abdominal pain.  Skin: Positive for color change.    Physical Exam Updated Vital  Signs BP 124/87   Pulse 100   Temp 98 F (36.7 C)   Resp 17   Ht 1.829 m (6')   Wt 99.8 kg   SpO2 100%   BMI 29.84 kg/m   Physical Exam CONSTITUTIONAL: Well developed/well nourished HEAD: Normocephalic/atraumatic EYES: EOMI ENMT: Mucous membranes moist NECK: supple no meningeal signs CV: S1/S2 noted, no murmurs/rubs/gallops noted LUNGS: Lungs are clear to auscultation bilaterally, no apparent distress ABDOMEN: soft, nontender NEURO: Pt is awake/alert/appropriate, moves all extremitiesx4.  No facial droop.  EXTREMITIES: pulses normal/equal, full ROM, tenderness and erythema to the left hand.  No crepitus.  No drainage.  Patient can range the  left wrist.  He is able to move fingers on left hand with some pain.  See photo below SKIN: warm PSYCH: no abnormalities of mood noted, alert and oriented to situation      ED Results / Procedures / Treatments   Labs (all labs ordered are listed, but only abnormal results are displayed) Labs Reviewed - No data to display  EKG None  Radiology No results found.  Procedures Ultrasound ED Soft Tissue  Date/Time: 09/26/2019 12:30 AM Performed by: Zadie Rhine, MD Authorized by: Zadie Rhine, MD   Procedure details:    Indications: localization of abscess and evaluate for cellulitis     Transverse view:  Visualized   Longitudinal view:  Visualized   Images: archived   Location:    Location: upper extremity     Side:  Left Findings:     no abscess present    cellulitis present     Medications Ordered in ED Medications  cephALEXin (KEFLEX) capsule 500 mg (500 mg Oral Given 09/26/19 0040)  doxycycline (VIBRA-TABS) tablet 100 mg (100 mg Oral Given 09/26/19 0039)    ED Course  I have reviewed the triage vital signs and the nursing notes.     MDM Rules/Calculators/A&P                          Patient presents with cellulitis of the left hand.  No recent trauma.  No indication for imaging.  No crepitus.  Low suspicion for deep space infection No signs of obvious abscess on bedside ultrasound At this point it is not amenable to I&D. However do suspect there may be a component of MRSA.  I will double cover the patient with Keflex as well as doxycycline. He is appropriate for outpatient management, but we discussed return precautions.  If there is any worsening pain, fever of 100, streaking of erythema, he is to return to the ER immediately Final Clinical Impression(s) / ED Diagnoses Final diagnoses:  Cellulitis of left hand    Rx / DC Orders ED Discharge Orders         Ordered    cephALEXin (KEFLEX) 500 MG capsule  3 times daily     Discontinue  Reprint      09/26/19 0031    doxycycline (VIBRAMYCIN) 100 MG capsule  2 times daily     Discontinue  Reprint     09/26/19 0031           Zadie Rhine, MD 09/26/19 [phone]
